# Patient Record
Sex: Female | Born: 1960 | Race: White | Hispanic: No | Marital: Married
Health system: Midwestern US, Community
[De-identification: ages and names within clinical notes are randomized; demographics above are authoritative.]

## PROBLEM LIST (undated history)

## (undated) DIAGNOSIS — I10 Essential (primary) hypertension: Secondary | ICD-10-CM

## (undated) DIAGNOSIS — E785 Hyperlipidemia, unspecified: Secondary | ICD-10-CM

## (undated) HISTORY — PX: ABDOMINAL HYSTERECTOMY: SHX81

## (undated) HISTORY — PX: TONSILLECTOMY: SUR1361

## (undated) HISTORY — PX: FOOT FUSION: SHX956

---

## 2011-10-28 ENCOUNTER — Emergency Department (INDEPENDENT_AMBULATORY_CARE_PROVIDER_SITE_OTHER)
Admission: EM | Admit: 2011-10-28 | Discharge: 2011-10-28 | Disposition: A | Discharge status: Home / Self Care | Payer: 59 | Source: Home / Self Care | Attending: Emergency Medicine | Admitting: Emergency Medicine

## 2011-10-28 ENCOUNTER — Encounter: Payer: Self-pay | Admitting: Emergency Medicine

## 2011-10-28 DIAGNOSIS — T887XXA Unspecified adverse effect of drug or medicament, initial encounter: Secondary | ICD-10-CM

## 2011-10-28 DIAGNOSIS — J069 Acute upper respiratory infection, unspecified: Secondary | ICD-10-CM

## 2011-10-28 DIAGNOSIS — H6980 Other specified disorders of Eustachian tube, unspecified ear: Secondary | ICD-10-CM

## 2011-10-28 HISTORY — DX: Essential (primary) hypertension: I10

## 2011-10-28 HISTORY — DX: Hyperlipidemia, unspecified: E78.5

## 2011-10-28 MED ORDER — AZITHROMYCIN 250 MG PO TABS: ORAL_TABLET | ORAL | Status: AC

## 2011-10-28 NOTE — ED Notes (Signed)
Being treated for ear infx since 4 days ago; on Amoxicillin and z-pack. Feels dizzy and getting worse. Did have Flu vaccine.

## 2011-10-28 NOTE — ED Provider Notes (Signed)
History     CSN: [id number] Arrival date & time: 10/28/2011  5:03 PM   First MD Initiated Contact with Patient 10/28/11 1702      Chief Complaint  Patient presents with  . Nasal Congestion    (Consider location/radiation/quality/duration/timing/severity/associated sxs/prior treatment) HPI Dorothy Hall is a 50 y.o. female who complains of onset of cold symptoms for 7 days.  She was seen by her primary care doctor a few days ago and was prescribed amoxicillin and a Medrol Dosepak. She was also given a shot in clinic but she is unsure what that was. Ever since then she is feeling of ear pain is a little but better, however she's been having some swelling in the feeling of fullness around her shoulders and posterior neck and has been feeling a little dizzy as well. She does not feel chest pain or shortness of breath. No sore throat + cough No pleuritic pain No wheezing + nasal congestion + post-nasal drainage No sinus pain/pressure No chest congestion No itchy/red eyes + Rearache No hemoptysis No SOB No chills/sweats No fever No nausea No vomiting No abdominal pain No diarrhea No skin rashes No fatigue No myalgias No headache    Past Medical History  Diagnosis Date  . Hypertension   . Hyperlipemia     Past Surgical History  Procedure Date  . Tonsillectomy   . Abdominal hysterectomy   . Foot fusion     Family History  Problem Relation Age of Onset  . Hypertension Father     History  Substance Use Topics  . Smoking status: Not on file  . Smokeless tobacco: Not on file  . Alcohol Use: No    OB History    Grav Para Term Preterm Abortions TAB SAB Ect Mult Living                  Review of Systems  Allergies  Naproxen and Sulfa antibiotics  Home Medications   Current Outpatient Rx  Name Route Sig Dispense Refill  . DICLOFENAC EPOLAMINE 1.3 % TD PTCH Transdermal Place 1 patch onto the skin 2 (two) times daily.      . DULOXETINE HCL 60 MG PO CPEP Oral Take  60 mg by mouth daily.      . AZITHROMYCIN 250 MG PO TABS  Use as directed 1 each 0    BP 140/83  Pulse 87  Temp(Src) 98.5 F (36.9 C) (Oral)  Resp 16  Ht 5\' 7"  (1.702 m)  Wt 271 lb (122.925 kg)  BMI 42.44 kg/m2  SpO2 98%  Physical Exam  Nursing note and vitals reviewed. Constitutional: She is oriented to person, place, and time. She appears well-developed and well-nourished.  HENT:  Head: Normocephalic and atraumatic.  Right Ear: External ear and ear canal normal. Tympanic membrane is bulging.  Left Ear: External ear and ear canal normal. Tympanic membrane is bulging.  Nose: Mucosal edema and rhinorrhea present.  Mouth/Throat: Uvula is midline and mucous membranes are normal. No uvula swelling. No oropharyngeal exudate, posterior oropharyngeal edema or posterior oropharyngeal erythema.  Neck: Trachea normal and normal range of motion. Neck supple. No edema and normal range of motion present.  Cardiovascular: Regular rhythm and normal heart sounds.   Pulmonary/Chest: Effort normal and breath sounds normal. No stridor. No respiratory distress.  Neurological: She is alert and oriented to person, place, and time.  Skin: Skin is warm and dry.  Psychiatric: She has a normal mood and affect. Her speech is normal.  ED Course  Procedures (including critical care time)  Labs Reviewed - No data to display No results found.   1. Medication side effect   2. Eustachian tube dysfunction   3. Acute upper respiratory infections of unspecified site       MDM    I believe she was treated properly for likely otitis media.I advised that she stop her Medrol Dosepak since I believe the she's been experiencing some generalized swelling secondary to the steroids. I think that this is what she's been feeling around her neck area. I did not see any evidence on physical examination of respiratory compromise. I will also switch her over to a Z-Pak and see if this helps a little but better. I feel  that this is mostly eustachian tube dysfunction and she can try antihistamine and decongestant to see if this helpful but better. Perhaps even chewing gum yawning may help as well. If she is not improving in the next few days she can call her primary care doctor. If she has any acute shortness of breath or worsening of swelling I have advised that she go to the emergency room.      Lily Kocher, MD 10/28/11 [phone]

## 2015-10-17 ENCOUNTER — Emergency Department (INDEPENDENT_AMBULATORY_CARE_PROVIDER_SITE_OTHER)
Admission: EM | Admit: 2015-10-17 | Discharge: 2015-10-17 | Disposition: A | Discharge status: Home / Self Care | Payer: 59 | Source: Home / Self Care | Attending: Emergency Medicine | Admitting: Emergency Medicine

## 2015-10-17 ENCOUNTER — Encounter: Payer: Self-pay | Admitting: Emergency Medicine

## 2015-10-17 DIAGNOSIS — H6691 Otitis media, unspecified, right ear: Secondary | ICD-10-CM | POA: Diagnosis not present

## 2015-10-17 MED ORDER — AZITHROMYCIN 250 MG PO TABS: 250.0000 mg | ORAL_TABLET | Freq: Every day | ORAL | Status: DC

## 2015-10-17 NOTE — ED Notes (Signed)
[facility]/O sinus pressure in face with right ear pain 4/10, cough cold and congestion, times 6 days, no fever.

## 2015-10-17 NOTE — Discharge Instructions (Signed)

## 2015-10-18 NOTE — ED Provider Notes (Signed)
CSN: [id number]     Arrival date & time 10/17/15  1619 History   First MD Initiated Contact with Patient 10/17/15 1659     Chief Complaint  Patient presents with  . Sinus Problem  . Facial Pain   (Consider location/radiation/quality/duration/timing/severity/associated sxs/prior Treatment) Patient is a 54 y.o. female presenting with sinus complaint. The history is provided by the patient. A language interpreter was used.  Sinus Problem This is a new problem. Episode onset: 6 days. The problem occurs constantly. The problem has been gradually worsening. Associated symptoms include shortness of breath. Pertinent negatives include no chest pain. Nothing aggravates the symptoms. Nothing relieves the symptoms. She has tried nothing for the symptoms. The treatment provided no relief.  Pt complains of sinus symptoms for the past week.   Pt reports now she is having bad pain in her right ear.  Pt has had an ear infection in the past and pain feels the same  Past Medical History  Diagnosis Date  . Hypertension   . Hyperlipemia    Past Surgical History  Procedure Laterality Date  . Tonsillectomy    . Abdominal hysterectomy    . Foot fusion     Family History  Problem Relation Age of Onset  . Hypertension Father    Social History  Substance Use Topics  . Smoking status: Never Smoker   . Smokeless tobacco: None  . Alcohol Use: No   OB History    No data available     Review of Systems  HENT: Positive for ear pain, rhinorrhea and sinus pressure.   Respiratory: Positive for shortness of breath.   Cardiovascular: Negative for chest pain.  All other systems reviewed and are negative.   Allergies  Naproxen; Sulfa antibiotics; and Penicillins  Home Medications   Prior to Admission medications   Medication Sig Start Date End Date Taking? Authorizing Provider  azithromycin (ZITHROMAX) 250 MG tablet Take 1 tablet (250 mg total) by mouth daily. Take first 2 tablets together, then 1  every day until finished. 10/17/15   Elson AreasLeslie K Sofia, PA-C  diclofenac (FLECTOR) 1.3 % PTCH Place 1 patch onto the skin 2 (two) times daily.      Historical Provider, MD  DULoxetine (CYMBALTA) 60 MG capsule Take 60 mg by mouth daily.      Historical Provider, MD   Meds Ordered and Administered this Visit  Medications - No data to display  BP 112/79 mmHg  Pulse 99  Temp(Src) 98.9 F (37.2 C) (Oral)  Resp 18  Ht 5\' 7"  (1.702 m)  Wt 290 lb 12 oz (131.883 kg)  BMI 45.53 kg/m2  SpO2 97% No data found.   Physical Exam  Constitutional: She appears well-developed and well-nourished.  HENT:  Head: Normocephalic and atraumatic.  Left Ear: External ear normal.  Nose: Nose normal.  Mouth/Throat: Oropharynx is clear and moist.  Right tm erythematous, slight bulging,  Unable to visualize landmarks  Eyes: Conjunctivae are normal. Pupils are equal, round, and reactive to light.  Neck: Normal range of motion.  Cardiovascular: Normal rate.   Pulmonary/Chest: Effort normal.  Abdominal: Soft.  Musculoskeletal: Normal range of motion.  Neurological: She is alert.  Skin: Skin is warm.  Psychiatric: She has a normal mood and affect.  Nursing note and vitals reviewed.   ED Course  Procedures (including critical care time)  Labs Review Labs Reviewed - No data to display  Imaging Review No results found.   Visual Acuity Review  Right Eye Distance:  Left Eye Distance:   Bilateral Distance:    Right Eye Near:   Left Eye Near:    Bilateral Near:          Pt counseled on ear infection.  Pt advised to use decongestant to help with sinus congestion.  Pt given rx for zithromax.  Pt advised she needs to see her MD in one week to make sure infection resolves.    1. Acute right otitis media, recurrence not specified, unspecified otitis media type    Meds ordered this encounter  Medications  . azithromycin (ZITHROMAX) 250 MG tablet    Sig: Take 1 tablet (250 mg total) by mouth daily.  Take first 2 tablets together, then 1 every day until finished.    Dispense:  6 tablet    Refill:  0    Order Specific Question:  Supervising Provider    Answer:  Georgina Pillion, DAVID [[id number]]    An After Visit Summary was printed and given to the patient.  Lonia Skinner Cameron, PA-C 10/18/15 1357

## 2016-06-06 ENCOUNTER — Encounter: Payer: Self-pay | Admitting: *Deleted

## 2016-06-06 ENCOUNTER — Emergency Department (INDEPENDENT_AMBULATORY_CARE_PROVIDER_SITE_OTHER): Payer: 59

## 2016-06-06 ENCOUNTER — Emergency Department (INDEPENDENT_AMBULATORY_CARE_PROVIDER_SITE_OTHER)
Admission: EM | Admit: 2016-06-06 | Discharge: 2016-06-06 | Disposition: A | Discharge status: Home / Self Care | Payer: 59 | Source: Home / Self Care | Attending: Family Medicine | Admitting: Family Medicine

## 2016-06-06 DIAGNOSIS — X58XXXA Exposure to other specified factors, initial encounter: Secondary | ICD-10-CM

## 2016-06-06 DIAGNOSIS — S92401A Displaced unspecified fracture of right great toe, initial encounter for closed fracture: Secondary | ICD-10-CM | POA: Diagnosis not present

## 2016-06-06 DIAGNOSIS — S92411A Displaced fracture of proximal phalanx of right great toe, initial encounter for closed fracture: Secondary | ICD-10-CM | POA: Diagnosis not present

## 2016-06-06 DIAGNOSIS — S80211A Abrasion, right knee, initial encounter: Secondary | ICD-10-CM

## 2016-06-06 MED ORDER — IBUPROFEN 600 MG PO TABS: 600.0000 mg | ORAL_TABLET | Freq: Once | ORAL | Status: DC

## 2016-06-06 MED ORDER — HYDROCODONE-ACETAMINOPHEN 5-325 MG PO TABS: 1.0000 | ORAL_TABLET | Freq: Four times a day (QID) | ORAL | Status: DC | PRN

## 2016-06-06 NOTE — ED Notes (Signed)
Pt c/o RT great toe pain post fall today at 1900. No OTC meds.

## 2016-06-06 NOTE — Discharge Instructions (Signed)
Norco/Vicodin (hydrocodone-acetaminophen) is a narcotic pain medication, do not combine these medications with others containing tylenol. While taking, do not drink alcohol, drive, or perform any other activities that requires focus while taking these medications.   Please keep foot elevated as much as possible.  Do not put any weight on your foot.  Please call tomorrow at [facility]8AM for follow up appointment with Sports Medicine as you will likely need referral to an orthopedic surgeon for proper and faster healing of broken toe.

## 2016-06-06 NOTE — ED Provider Notes (Signed)
CSN: [id number]     Arrival date & time 06/06/16  1940 History   First MD Initiated Contact with Patient 06/06/16 2001     Chief Complaint  Patient presents with  . Foot Injury   (Consider location/radiation/quality/duration/timing/severity/associated sxs/prior Treatment) HPI Dorothy Hall is a [age] y.o. female presenting to [facility] with c/o sudden onset Right great toe pain with deformity after tripping on cement step while walking to the pool around 7PM.  Pain is 7/10, aching and throbbing, waxing and waning. Unable to bear weight. She also reports scrapping her Right knee but only has minimal pain of knee.  No pain medication taken PTA.  Denies hitting her head or LOC. Hx of foot surgery about 15 years ago. Does not currently have an orthopedist.     Past Medical History  Diagnosis Date  . Hypertension   . Hyperlipemia    Past Surgical History  Procedure Laterality Date  . Tonsillectomy    . Abdominal hysterectomy    . Foot fusion     Family History  Problem Relation Age of Onset  . Hypertension Father    Social History  Substance Use Topics  . Smoking status: Never Smoker   . Smokeless tobacco: None  . Alcohol Use: Yes   OB History    No data available     Review of Systems  Musculoskeletal: Positive for myalgias, joint swelling and arthralgias.  Skin: Positive for wound. Negative for color change.  Neurological: Negative for weakness and numbness.    Allergies  Prednisone; Sulfa antibiotics; Naproxen; and Penicillins  Home Medications   Prior to Admission medications   Medication Sig Start Date End Date Taking? Authorizing Provider  amLODipine (NORVASC) 5 MG tablet Take 5 mg by mouth daily.   Yes Historical Provider, MD  hydrochlorothiazide (MICROZIDE) 12.5 MG capsule Take 12.5 mg by mouth daily.   Yes Historical Provider, MD  levothyroxine (SYNTHROID, LEVOTHROID) 100 MCG tablet Take 100 mcg by mouth daily before breakfast.   Yes Historical Provider, MD  sertraline  (ZOLOFT) 50 MG tablet Take 50 mg by mouth daily.   Yes Historical Provider, MD  DULoxetine (CYMBALTA) 60 MG capsule Take 60 mg by mouth daily.      Historical Provider, MD  HYDROcodone-acetaminophen (NORCO/VICODIN) 5-325 MG tablet Take 1 tablet by mouth every 6 (six) hours as needed for moderate pain or severe pain. 06/06/16   Junius FinnerErin O'Malley, PA-C   Meds Ordered and Administered this Visit   Medications  ibuprofen (ADVIL,MOTRIN) tablet 600 mg (not administered)    BP 141/83 mmHg  Pulse 94  Temp(Src) 97.7 F (36.5 C) (Oral)  Resp 18  Ht 5' 6.75" (1.695 m)  Wt 305 lb (138.347 kg)  BMI 48.15 kg/m2  SpO2 98% No data found.   Physical Exam  Constitutional: She is oriented to person, place, and time. She appears well-developed and well-nourished.  HENT:  Head: Normocephalic and atraumatic.  Eyes: EOM are normal.  Neck: Normal range of motion.  Cardiovascular: Normal rate.   Right great toe: cap refill < 3 seconds  Pulmonary/Chest: Effort normal.  Musculoskeletal: She exhibits edema and tenderness.  Right great toe: mild deformity, limited flexion and extension. Diffuse tenderness.   Right foot: well healed surgical scar along first metatarsal. Non-tender. Full ROM ankle and knee.  Calf is soft, non-tender.  Neurological: She is alert and oriented to person, place, and time.  Right great toe: normal sensation to light touch.  Skin: Skin is warm and dry.  Right  great toe: skin in tact, slight tenting of the skin.  No ecchymosis or erythema. Right knee: superficial abrasion, bleeding controlled.   Psychiatric: She has a normal mood and affect. Her behavior is normal.  Nursing note and vitals reviewed.   ED Course  Procedures (including critical care time)  Labs Review Labs Reviewed - No data to display  Imaging Review Dg Foot Complete Right  06/06/2016  CLINICAL DATA:  Right foot pain EXAM: RIGHT FOOT COMPLETE - 3+ VIEW COMPARISON:  None. FINDINGS: There is a transverse  fracture of the distal aspect of the first proximal phalanx. There is evidence of prior Lisfranc joint arthrodesis without hardware failure complication. 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 There is a plantar calcaneal spur. IMPRESSION: Transverse fracture of the distal aspect of the first proximal phalanx. Electronically Signed   By: Elige Ko   On: 06/06/2016 20:23      MDM   1. Fracture of great toe, right, closed, initial encounter   2. Knee abrasion, right, initial encounter    Pt c/o sudden onset Right great toe. Mild deformity. Sensation and circulation in tact. Slight decreased ROM. Diffuse tenderness.  Plain films: significant for transverse fracture of distal aspect of first proximal phalanx. Consulted with Dr. Benjamin Stain, Sports Medicine. Pt will likely need referral to orthopedic surgeon.  Will place in post-op shoe for now. Pt has crutches at home. Advised to keep elevated and not bear any weight on foot.   Call office first thing tomorrow at 8AM to schedule f/u.  Rx: norco Pt has diclofenac at home, may take as well for pain.  Patient verbalized understanding and agreement with treatment plan.      Junius Finner, PA-C 06/06/16 2056

## 2016-06-07 ENCOUNTER — Ambulatory Visit (INDEPENDENT_AMBULATORY_CARE_PROVIDER_SITE_OTHER): Payer: 59

## 2016-06-07 ENCOUNTER — Encounter: Payer: Self-pay | Admitting: Sports Medicine

## 2016-06-07 ENCOUNTER — Ambulatory Visit (INDEPENDENT_AMBULATORY_CARE_PROVIDER_SITE_OTHER): Payer: 59 | Admitting: Sports Medicine

## 2016-06-07 ENCOUNTER — Telehealth: Payer: Self-pay

## 2016-06-07 ENCOUNTER — Telehealth: Payer: Self-pay | Admitting: *Deleted

## 2016-06-07 VITALS — BP 124/89 | HR 87 | Resp 18 | Wt 305.0 lb

## 2016-06-07 DIAGNOSIS — X58XXXA Exposure to other specified factors, initial encounter: Secondary | ICD-10-CM | POA: Diagnosis not present

## 2016-06-07 DIAGNOSIS — S92401A Displaced unspecified fracture of right great toe, initial encounter for closed fracture: Secondary | ICD-10-CM | POA: Diagnosis not present

## 2016-06-07 DIAGNOSIS — S92411A Displaced fracture of proximal phalanx of right great toe, initial encounter for closed fracture: Secondary | ICD-10-CM

## 2016-06-07 NOTE — Telephone Encounter (Signed)
Pt is in need of a letter for works stating her work restrictions. Please Petra Kubaadivise

## 2016-06-07 NOTE — Telephone Encounter (Signed)
Spoke to pt advised her per dr T the ortho surgeon said her toe was non operative. Dr T will see her this morning to reduce the fracture. Pt to call his office to schedule appt. Dorothy Hall in primary care notified okay to schedule appt with Dr T this morning. Dorothy Catholichristy Jacqueline Spofford, LPN

## 2016-06-07 NOTE — Progress Notes (Signed)
   Subjective:    I'm seeing this patient as a consultation for:  Dorothy FinnerErin O'Malley PA-C, Dorothy Hall, M.D.  CC: Right great toe fracture  HPI: Yesterday this pleasant 55 year old female fell down the stairs, she had immediate pain, swelling, bruising and deformity in her toe, she was seen in urgent care where x-rays showed a displaced, angulated fracture of the right first proximal phalanx. She is referred to me for further evaluation and definitive treatment. Pain is moderate, improving. She has been in a postop shoe.  Past medical history, Surgical history, Family history not pertinant except as noted below, Social history, Allergies, and medications have been entered into the medical record, reviewed, and no changes needed.   Review of Systems: No headache, visual changes, nausea, vomiting, diarrhea, constipation, dizziness, abdominal pain, skin rash, fevers, chills, night sweats, weight loss, swollen lymph nodes, body aches, joint swelling, muscle aches, chest pain, shortness of breath, mood changes, visual or auditory hallucinations.   Objective:   General: Well Developed, well nourished, and in no acute distress.  Neuro/Psych: Alert and oriented x3, extra-ocular muscles intact, able to move all 4 extremities, sensation grossly intact. Skin: Warm and dry, no rashes noted.  Respiratory: Not using accessory muscles, speaking in full sentences, trachea midline.  Cardiovascular: Pulses palpable, no extremity edema. Abdomen: Does not appear distended. Right Foot: No visible erythema or swelling. Range of motion is full in all directions. Strength is 5/5 in all directions. No hallux valgus. No pes cavus or pes planus. No abnormal callus noted. No pain over the navicular prominence, or base of fifth metatarsal. No tenderness to palpation of the calcaneal insertion of plantar fascia. No pain at the Achilles insertion. No pain over the calcaneal bursa. No pain of the retrocalcaneal  bursa. Swelling and tenderness over the first proximal phalanx No hallux rigidus or limitus. No tenderness palpation over interphalangeal joints. No pain with compression of the metatarsal heads. Neurovascularly intact distally.  X-rays reviewed and show an apex plantar, 50% dorsally displaced fracture at the distal condyle of the right first proximal phalanx.  Procedure:  Fracture Reduction   Risks, benefits, and alternatives explained and consent obtained. Time out conducted. Surface prepped with alcohol. 5 mL lidocaine and Marcaine infiltrated in a hematoma block. Adequate anesthesia ensured. Fracture reduction: I applied axial force to the interphalangeal joint, applied a valgus stress, and then plantar flex the IP joint and applied a varus force, I felt a pop. Post reduction films obtained showed anatomic/near-anatomic alignment. Pt stable, aftercare and follow-up advised.  Impression and Recommendations:   This case required medical decision making of moderate complexity.

## 2016-06-07 NOTE — Assessment & Plan Note (Signed)
Apex volar angulation with a dorsally displaced distal fragment at the distal tip of the proximal phalanx of the right great toe. Close reduction, postop shoe, patient has some pain medicine already. Return to see me in 2 weeks, repeat x-rays before visit.   I billed a fracture code for this encounter, all subsequent visits will be post-op checks in the global period.

## 2016-06-08 NOTE — Telephone Encounter (Signed)
Letter in box. 

## 2016-06-08 NOTE — Telephone Encounter (Signed)
Left VM stating letter is available for pick up.

## 2016-06-12 ENCOUNTER — Telehealth: Payer: Self-pay

## 2016-06-12 NOTE — Telephone Encounter (Signed)
Pt left VM asking if her work note can have more specifics: Can she navigate stairs? How long is she allowed to work in a seated position? Please assist.

## 2016-06-12 NOTE — Telephone Encounter (Signed)
Letter printed.

## 2016-06-20 ENCOUNTER — Other Ambulatory Visit: Payer: Self-pay | Admitting: Sports Medicine

## 2016-06-20 ENCOUNTER — Encounter: Payer: Self-pay | Admitting: Sports Medicine

## 2016-06-20 ENCOUNTER — Ambulatory Visit (INDEPENDENT_AMBULATORY_CARE_PROVIDER_SITE_OTHER): Payer: 59 | Admitting: Sports Medicine

## 2016-06-20 ENCOUNTER — Ambulatory Visit (INDEPENDENT_AMBULATORY_CARE_PROVIDER_SITE_OTHER): Payer: 59

## 2016-06-20 DIAGNOSIS — S92401D Displaced unspecified fracture of right great toe, subsequent encounter for fracture with routine healing: Secondary | ICD-10-CM

## 2016-06-20 DIAGNOSIS — W010XXD Fall on same level from slipping, tripping and stumbling without subsequent striking against object, subsequent encounter: Secondary | ICD-10-CM | POA: Diagnosis not present

## 2016-06-20 DIAGNOSIS — S92421D Displaced fracture of distal phalanx of right great toe, subsequent encounter for fracture with routine healing: Secondary | ICD-10-CM

## 2016-06-20 NOTE — Progress Notes (Signed)
  Subjective: 2 weeks post closed reduction of a right first proximal phalangeal fracture, doing well.   Objective: General: Well-developed, well-nourished, and in no acute distress. Right foot: Minimally swollen, only minimally tender over the fracture, neurovascularly intact distally.  X-rays reviewed and show good stability with anatomic alignment of the right first proximal phalanx.  Assessment/plan:

## 2016-06-20 NOTE — Assessment & Plan Note (Signed)
Continued stability of alignment postreduction.  Continue postop shoe for 4 more weeks, return to see me in 4 weeks, no more x-rays needed. We will likely transition her to a regular shoe in 4 weeks at the six-week mark

## 2016-07-19 ENCOUNTER — Encounter: Payer: Self-pay | Admitting: Sports Medicine

## 2016-07-19 ENCOUNTER — Ambulatory Visit (INDEPENDENT_AMBULATORY_CARE_PROVIDER_SITE_OTHER): Payer: 59 | Admitting: Sports Medicine

## 2016-07-19 DIAGNOSIS — S92401D Displaced unspecified fracture of right great toe, subsequent encounter for fracture with routine healing: Secondary | ICD-10-CM

## 2016-07-19 NOTE — Assessment & Plan Note (Signed)
Doing well post fracture and reduction, return for custom orthotics, no further intervention needed.

## 2016-07-19 NOTE — Progress Notes (Signed)
  Subjective: 6 weeks post fracture of the first proximal phalanx, doing well.   Objective: General: Well-developed, well-nourished, and in no acute distress. Right foot: Minimal swelling at the interphalangeal joint, motion is pretty good, no tenderness to palpation.  Assessment/plan:   No problem-specific Assessment & Plan notes found for this encounter.

## 2016-07-24 ENCOUNTER — Ambulatory Visit (INDEPENDENT_AMBULATORY_CARE_PROVIDER_SITE_OTHER): Payer: 59 | Admitting: Sports Medicine

## 2016-07-24 DIAGNOSIS — S92401D Displaced unspecified fracture of right great toe, subsequent encounter for fracture with routine healing: Secondary | ICD-10-CM

## 2016-07-24 NOTE — Assessment & Plan Note (Signed)
Custom orthotics as above. 

## 2016-07-24 NOTE — Progress Notes (Signed)

## 2017-07-22 IMAGING — DX DG FOOT COMPLETE 3+V*R*
3 series · 3 of 3 positions shown · non-contrast
Comparison: None.

CLINICAL DATA: Right foot pain

EXAM:
RIGHT FOOT COMPLETE - 3+ VIEW

[foot ap]
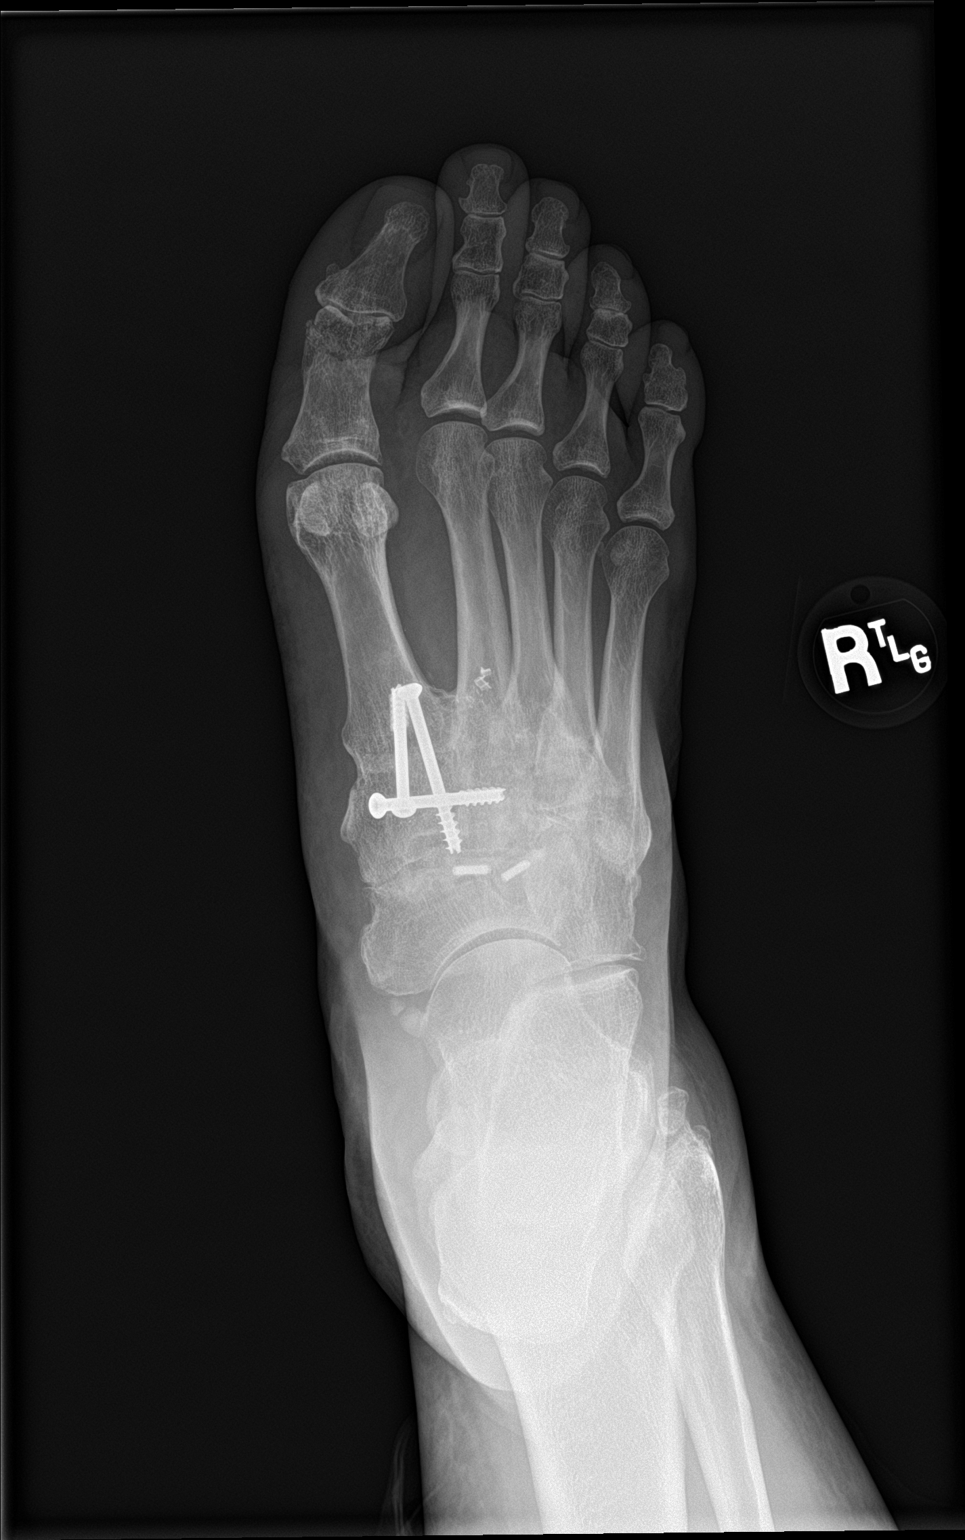

[foot obl]
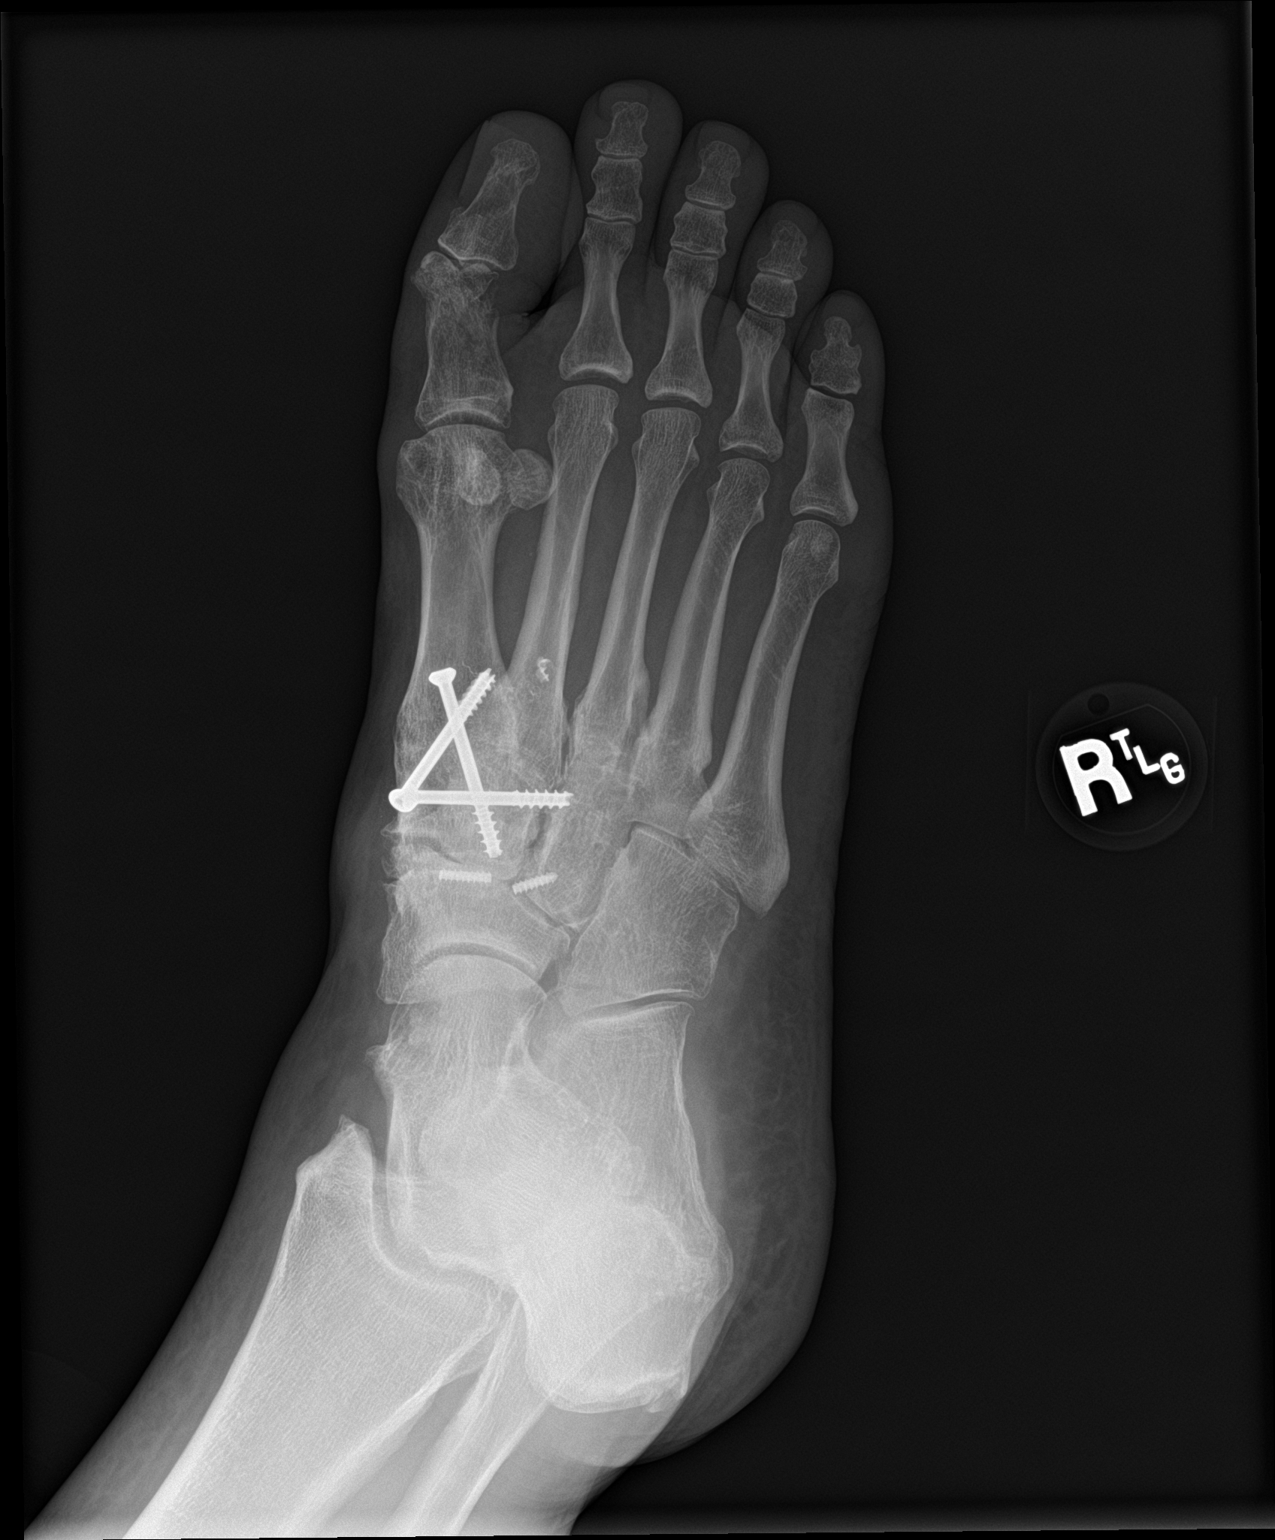

[foot lat]
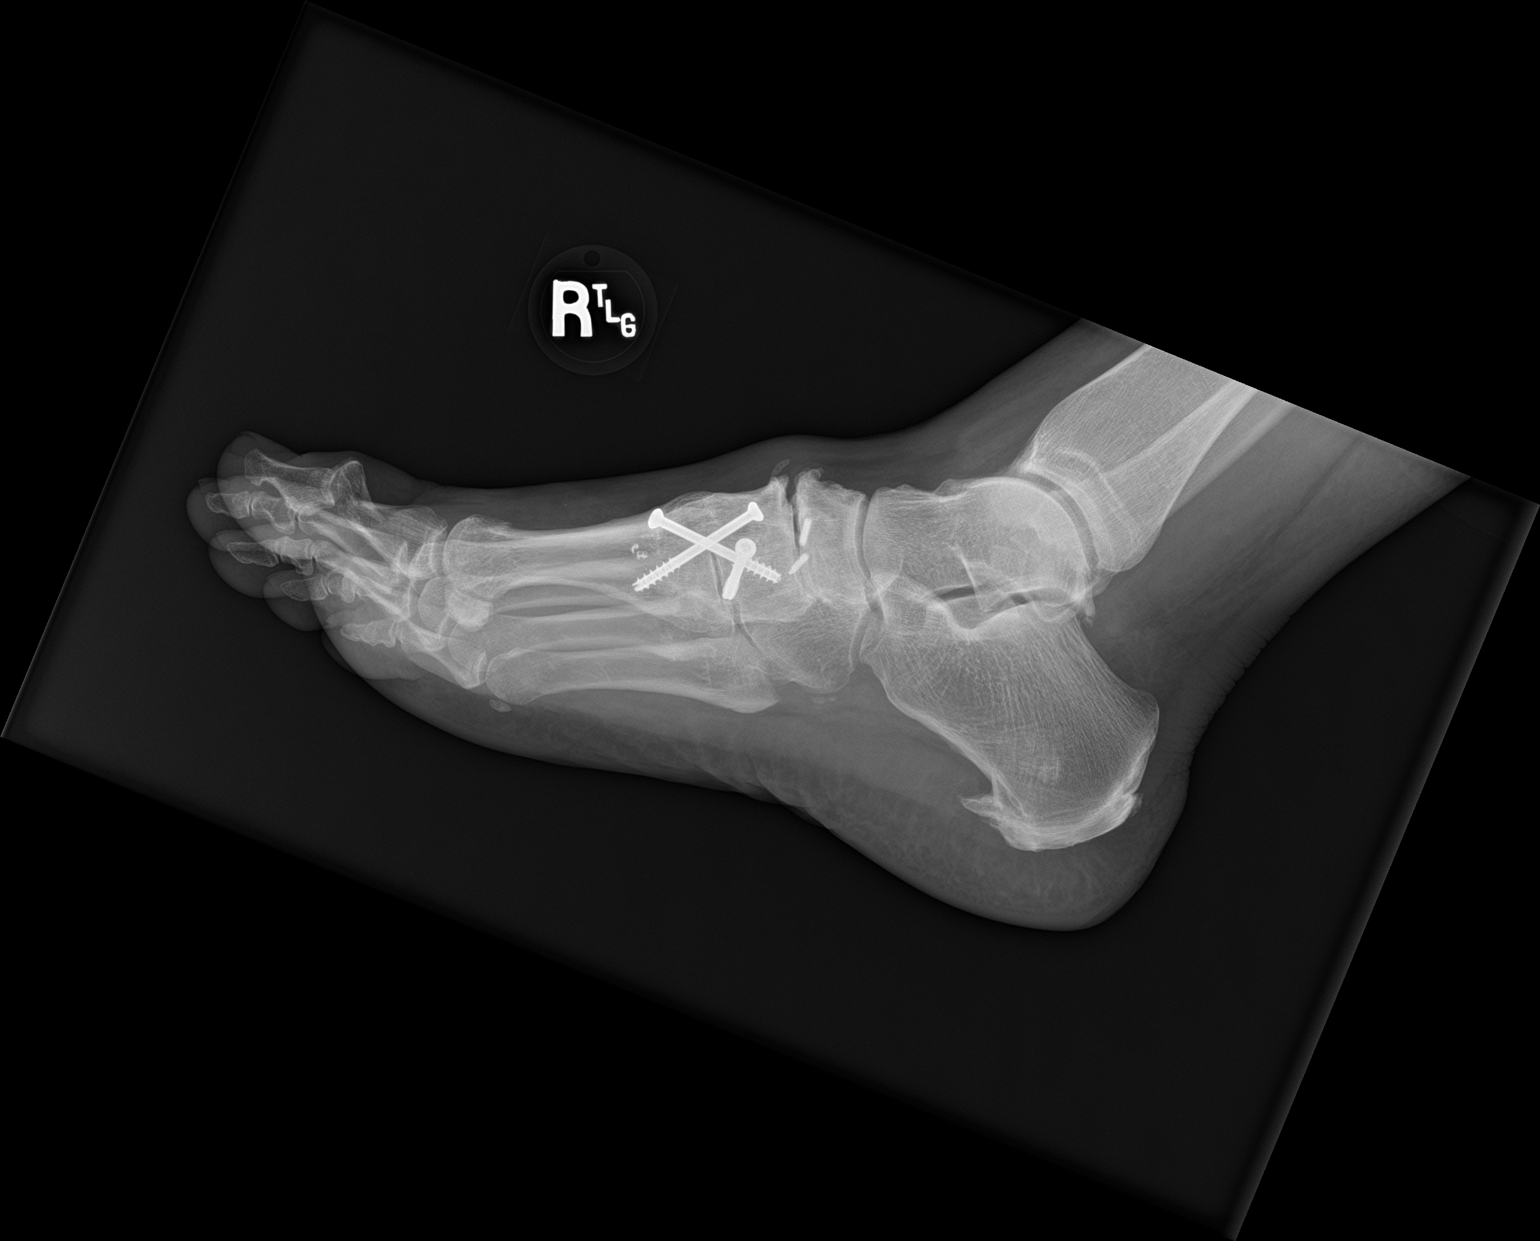

[3 of 3 positions shown; findings below may reference images not displayed]

FINDINGS: There is a transverse fracture of the distal aspect of the first
proximal phalanx. There is evidence of prior Lisfranc joint
arthrodesis without hardware failure complication. Severe
osteoarthritis of the navicular cuneiform articulation. There is a
plantar calcaneal spur.
IMPRESSION: Transverse fracture of the distal aspect of the first proximal
phalanx.

## 2017-08-05 IMAGING — DX DG TOE GREAT 2+V*R*
3 series · 3 of 3 positions shown · non-contrast
Comparison: June 07, 2016

CLINICAL DATA: Persistent pain after recent traumatic injury

EXAM:
RIGHT FIRST TOE:  3  V

[toe ap]
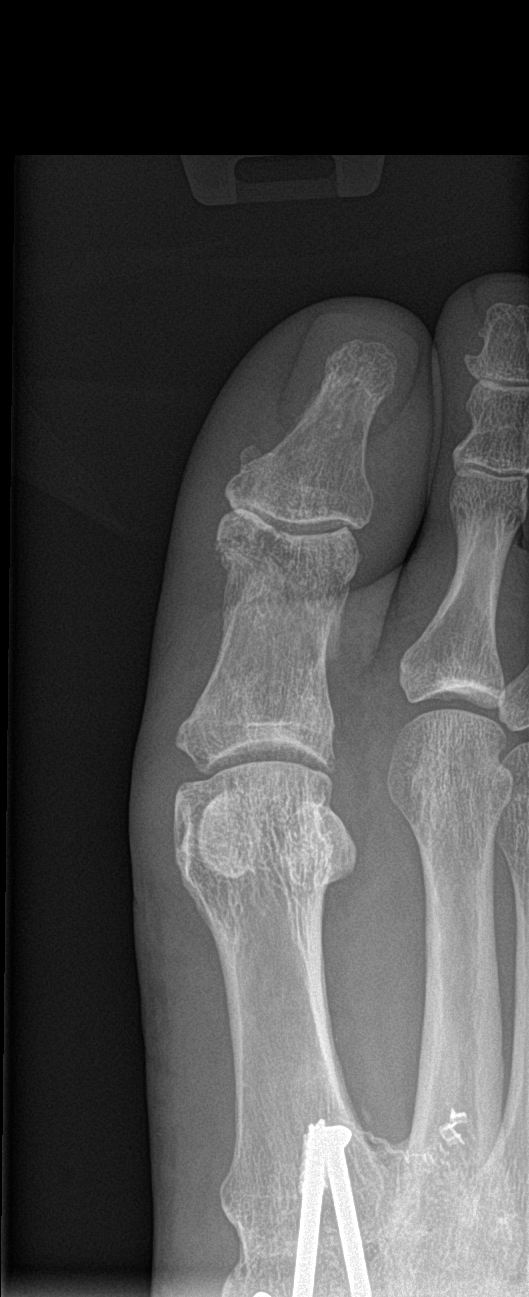

[toe obl]
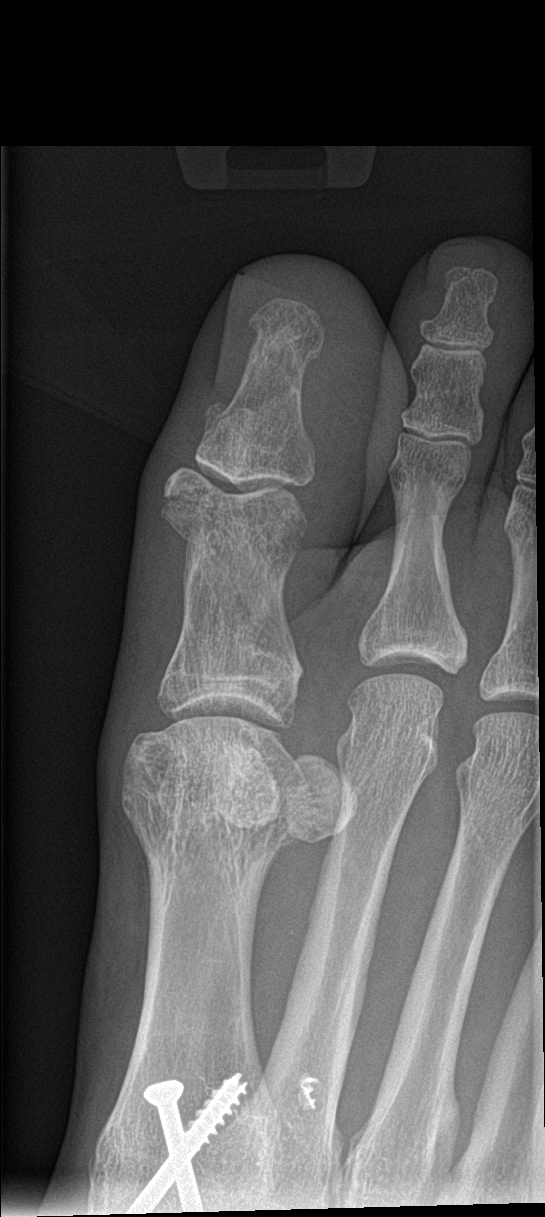

[toe lat]
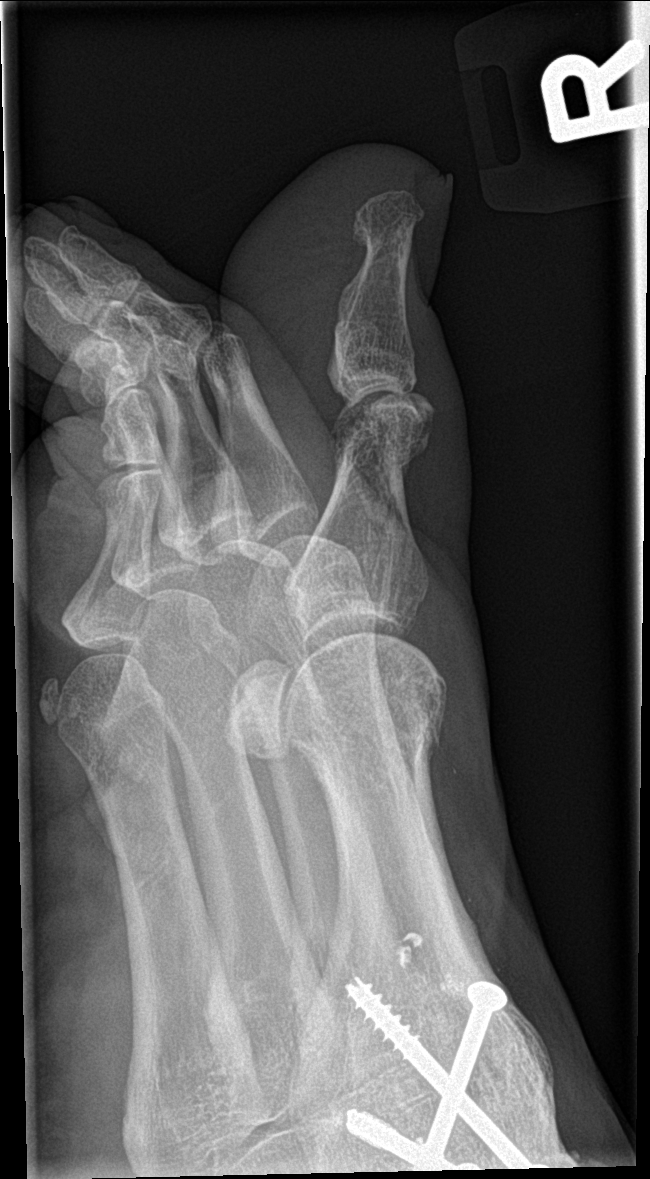

[3 of 3 positions shown; findings below may reference images not displayed]

FINDINGS: Frontal, oblique, and lateral views were obtained. The fracture of
the distal aspect of the first proximal phalanx is again noted with
fracture fragments in near anatomic alignment. There is no
appreciable callus formation. No new fracture. No dislocation. There
is mild narrowing of the first MTP joint. Postoperative change is
noted in the first tarsal-metatarsal joint region.
IMPRESSION: Fracture distal aspect of the first proximal phalanx with alignment
near anatomic and essentially stable. No appreciable callus
formation. No new fracture. No dislocation. Previous postoperative
change in the first tarsal -metatarsal joint region.

## 2019-11-26 ENCOUNTER — Inpatient Hospital Stay
Admit: 2019-11-26 | Discharge: 2019-11-26 | Disposition: A | Discharge status: Home / Self Care | Payer: BLUE CROSS/BLUE SHIELD | Attending: Emergency Medicine

## 2019-11-26 ENCOUNTER — Emergency Department: Admit: 2019-11-26 | Payer: BLUE CROSS/BLUE SHIELD | Primary: Family

## 2019-11-26 DIAGNOSIS — R079 Chest pain, unspecified: Secondary | ICD-10-CM

## 2019-11-26 LAB — METABOLIC PANEL, COMPREHENSIVE
A-G Ratio: 1.1 — ABNORMAL LOW (ref 1.2–3.5)
ALT (SGPT): 55 U/L (ref 12–65)
AST (SGOT): 30 U/L (ref 15–37)
Albumin: 3.9 g/dL (ref 3.5–5.0)
Alk. phosphatase: 97 U/L (ref 50–136)
Anion gap: 5 mmol/L — ABNORMAL LOW (ref 7–16)
BUN: 10 MG/DL (ref 6–23)
Bilirubin, total: 0.9 MG/DL (ref 0.2–1.1)
CO2: 27 mmol/L (ref 21–32)
Calcium: 9.5 MG/DL (ref 8.3–10.4)
Chloride: 107 mmol/L (ref 98–107)
Creatinine: 0.84 MG/DL (ref 0.6–1.0)
GFR est AA: >60 mL/min/{1.73_m2} (ref >60)
GFR est non-AA: >60 mL/min/{1.73_m2} (ref >60)
Globulin: 3.7 g/dL — ABNORMAL HIGH (ref 2.3–3.5)
Glucose: 90 mg/dL (ref 65–100)
Potassium: 3.8 mmol/L (ref 3.5–5.1)
Protein, total: 7.6 g/dL (ref 6.3–8.2)
Sodium: 139 mmol/L (ref 136–145)

## 2019-11-26 LAB — CBC WITH AUTOMATED DIFF
ABS. BASOPHILS: 0.1 10*3/uL (ref 0.0–0.2)
ABS. EOSINOPHILS: 0.1 10*3/uL (ref 0.0–0.8)
ABS. IMM. GRANS.: 0 10*3/uL (ref 0.0–0.5)
ABS. LYMPHOCYTES: 3.1 10*3/uL (ref 0.5–4.6)
ABS. MONOCYTES: 0.4 10*3/uL (ref 0.1–1.3)
ABS. NEUTROPHILS: 3 10*3/uL (ref 1.7–8.2)
ABSOLUTE NRBC: 0 10*3/uL (ref 0.0–0.2)
BASOPHILS: 1 % (ref 0.0–2.0)
EOSINOPHILS: 2 % (ref 0.5–7.8)
HCT: 44.8 % (ref 35.8–46.3)
HGB: 14.7 g/dL (ref 11.7–15.4)
IMMATURE GRANULOCYTES: 0 % (ref 0.0–5.0)
LYMPHOCYTES: 46 % — ABNORMAL HIGH (ref 13–44)
MCH: 29.7 PG (ref 26.1–32.9)
MCHC: 32.8 g/dL (ref 31.4–35.0)
MCV: 90.5 FL (ref 79.6–97.8)
MONOCYTES: 6 % (ref 4.0–12.0)
MPV: 9.5 FL (ref 9.4–12.3)
NEUTROPHILS: 45 % (ref 43–78)
PLATELET COMMENTS: ADEQUATE
PLATELET: 254 10*3/uL (ref 150–450)
RBC: 4.95 M/uL (ref 4.05–5.2)
RDW: 12.8 % (ref 11.9–14.6)
WBC: 6.7 10*3/uL (ref 4.3–11.1)

## 2019-11-26 LAB — TROPONIN-HIGH SENSITIVITY
Troponin-High Sensitivity: 6.5 pg/mL (ref 0–14)
Troponin-High Sensitivity: 7.6 pg/mL (ref 0–14)

## 2019-11-26 LAB — CBC WITH AUTO DIFFERENTIAL
Basophils %: 1 % (ref 0.0–2.0)
Basophils Absolute: 0.1 10*3/uL (ref 0.0–0.2)
Eosinophils %: 2 % (ref 0.5–7.8)
Eosinophils Absolute: 0.1 10*3/uL (ref 0.0–0.8)
Granulocyte Absolute Count: 0 10*3/uL (ref 0.0–0.5)
Hematocrit: 44.8 % (ref 35.8–46.3)
Hemoglobin: 14.7 g/dL (ref 11.7–15.4)
Immature Granulocytes: 0 % (ref 0.0–5.0)
Lymphocytes %: 46 % — ABNORMAL HIGH (ref 13–44)
Lymphocytes Absolute: 3.1 10*3/uL (ref 0.5–4.6)
MCH: 29.7 PG (ref 26.1–32.9)
MCHC: 32.8 g/dL (ref 31.4–35.0)
MCV: 90.5 FL (ref 79.6–97.8)
MPV: 9.5 FL (ref 9.4–12.3)
Monocytes %: 6 % (ref 4.0–12.0)
Monocytes Absolute: 0.4 10*3/uL (ref 0.1–1.3)
NRBC Absolute: 0 10*3/uL (ref 0.0–0.2)
Neutrophils %: 45 % (ref 43–78)
Neutrophils Absolute: 3 10*3/uL (ref 1.7–8.2)
Platelet Comment: ADEQUATE
Platelets: 254 10*3/uL (ref 150–450)
RBC: 4.95 M/uL (ref 4.05–5.2)
RDW: 12.8 % (ref 11.9–14.6)
WBC: 6.7 10*3/uL (ref 4.3–11.1)

## 2019-11-26 LAB — COMPREHENSIVE METABOLIC PANEL
ALT: 55 U/L (ref 12–65)
AST: 30 U/L (ref 15–37)
Albumin/Globulin Ratio: 1.1 — ABNORMAL LOW (ref 1.2–3.5)
Albumin: 3.9 g/dL (ref 3.5–5.0)
Alkaline Phosphatase: 97 U/L (ref 50–136)
Anion Gap: 5 mmol/L — ABNORMAL LOW (ref 7–16)
BUN: 10 MG/DL (ref 6–23)
CO2: 27 mmol/L (ref 21–32)
Calcium: 9.5 MG/DL (ref 8.3–10.4)
Chloride: 107 mmol/L (ref 98–107)
Creatinine: 0.84 MG/DL (ref 0.6–1.0)
EGFR IF NonAfrican American: >60 mL/min/{1.73_m2} (ref >60)
GFR African American: >60 mL/min/{1.73_m2} (ref >60)
Globulin: 3.7 g/dL — ABNORMAL HIGH (ref 2.3–3.5)
Glucose: 90 mg/dL (ref 65–100)
Potassium: 3.8 mmol/L (ref 3.5–5.1)
Sodium: 139 mmol/L (ref 136–145)
Total Bilirubin: 0.9 MG/DL (ref 0.2–1.1)
Total Protein: 7.6 g/dL (ref 6.3–8.2)

## 2019-11-26 LAB — TROPONIN, HIGH SENSITIVITY
Troponin, High Sensitivity: 6.5 pg/mL (ref 0–14)
Troponin, High Sensitivity: 7.6 pg/mL (ref 0–14)

## 2019-11-26 MED ORDER — LABETALOL 5 MG/ML IV SOLN
5 mg/mL | Freq: Once | INTRAVENOUS | Status: DC
Start: 2019-11-26 — End: 2019-11-26

## 2019-11-26 MED ORDER — ASPIRIN 325 MG TAB
325 mg | ORAL | Status: AC
Start: 2019-11-26 — End: 2019-11-26
  Administered 2019-11-26: 19:00:00 via ORAL

## 2019-11-26 MED FILL — LABETALOL 5 MG/ML IV SOLN: 5 mg/mL | INTRAVENOUS | Qty: 20

## 2019-11-26 MED FILL — ASPIRIN 325 MG TAB: 325 mg | ORAL | Qty: 1

## 2019-11-26 NOTE — ED Notes (Signed)
Md made aware of pt's BP. MD ordered to hold labetalol.

## 2019-11-26 NOTE — ED Notes (Signed)

## 2019-11-26 NOTE — ED Triage Notes (Signed)
Pt states she has been dealing with htn for the last month.  For the last week she has had left sided chest pain that radiates to her jaw.  Describes pain as sharp and rates 4/10.  Does not see a cardiologist.  Takes medication for htn.  States she has some shob with exertion.

## 2019-11-26 NOTE — ED Provider Notes (Signed)
[facility]       HPI:    58 year old female history of obesity, hypertension presents to the emergency department with complaint of levator blood pressure for the past month despite her losartan being increased.  Over the past week she also reported some left-sided chest pain that radiates to her left jaw.  She does have some dyspnea on exertion.  Describes the pain as sharp and rates it a 4 out of 10.  She has no prior known history of CAD CHF VHD or cardiac arrhythmia.  No prior stress test or catheterization    Patient states she had an EKG from her PCP which was abnormal so she was sent to the ED for evaluation.    Patient denies any surgery requiring general anesthesia, travel, or significant immobilization in the past 4 weeks.  Pt denies hemoptysis.  Pt denies asymmetric lower extremity edema.  Pt denies known history of malignancy.  Denies prior history of venous thromboembolism or known hypercoagulable disorder.  Denies use of estrogen containing medications.    She does report a history of GERD and has been on omeprazole for the past few days.  Denies back pain or tearing sensation of the chest/thorax.    Patient declined any medications after my initial evaluation.    REVIEW OF SYSTEMS     ROS Negative Except as Listed in HPI    PAST MEDICAL HISTORY     No past medical history on file.  No past surgical history on file.     None     Allergies   Allergen Reactions   ??? Pcn [Penicillins] Anaphylaxis   ??? Naproxen Swelling   ??? Sulfa (Sulfonamide Antibiotics) Rash        PHYSICAL EXAM       Vitals:    11/26/19 1118   BP: (!) 175/111   Pulse: 71   Resp: 16   Temp: 97.8 ??F (36.6 ??C)   SpO2: 97%    Vital signs were reviewed.     Physical Exam  Vitals signs and nursing note reviewed.   Constitutional:       General: She is not in acute distress.     Appearance: She is obese. She is not ill-appearing, toxic-appearing or diaphoretic.   Neck:      Vascular: No JVD.    Cardiovascular:      Rate and Rhythm: Normal rate and regular rhythm.      Pulses:           Radial pulses are 2+ on the right side and 2+ on the left side.      Heart sounds: No murmur.   Pulmonary:      Effort: Pulmonary effort is normal.      Breath sounds: Normal breath sounds.   Abdominal:      Palpations: Abdomen is soft.   Musculoskeletal:      Comments: No lower extremity edema.  No palpable cords in lower extremities.  No calf knee or thigh tenderness.   Skin:     General: Skin is warm and dry.   Neurological:      Comments: Awake, alert. GCS 15. CN II-XII grossly intact. Speech clear. No gross lateralizing neuro deficits.    Psychiatric:         Behavior: Behavior normal.          MEDICAL DECISION MAKING       Initial Impression and Treatment Plan  Afebrile nontoxic 58 year old female  presented to ED with chest pain for the past week in the setting of increased blood pressures for the past month.  Initial EKG and troponin have already resulted by the time I evaluated her and they are without ischemia.  Patient does have both typical and atypical features for cardiac etiology.  Low risk Wells criteria for PE.  No back pain or tearing sensation the chest/thorax.  No murmurs.  Equal radial pulses.  Aortic dissection seems less likely.  No complaints of abdominal pain nor any abdominal tenderness on exam.    EKG performed at 11:10 AM.  Normal sinus rhythm at 69.  Normal axis.  No ST elevations or depressions.  No significant T wave abnormalities.  Intervals normal      Recent Results (from the past 8 hour(s))   CBC WITH AUTOMATED DIFF    Collection Time: 11/26/19 11:23 AM   Result Value Ref Range    WBC 6.7 4.3 - 11.1 K/uL    RBC 4.95 4.05 - 5.2 M/uL    HGB 14.7 11.7 - 15.4 g/dL    HCT 44.8 35.8 - 46.3 %    MCV 90.5 79.6 - 97.8 FL    MCH 29.7 26.1 - 32.9 PG    MCHC 32.8 31.4 - 35.0 g/dL    RDW 12.8 11.9 - 14.6 %    PLATELET 254 150 - 450 K/uL    MPV 9.5 9.4 - 12.3 FL    ABSOLUTE NRBC 0.00 0.0 - 0.2 K/uL     NEUTROPHILS 45 43 - 78 %    LYMPHOCYTES 46 (H) 13 - 44 %    MONOCYTES 6 4.0 - 12.0 %    EOSINOPHILS 2 0.5 - 7.8 %    BASOPHILS 1 0.0 - 2.0 %    IMMATURE GRANULOCYTES 0 0.0 - 5.0 %    ABS. NEUTROPHILS 3.0 1.7 - 8.2 K/UL    ABS. LYMPHOCYTES 3.1 0.5 - 4.6 K/UL    ABS. MONOCYTES 0.4 0.1 - 1.3 K/UL    ABS. EOSINOPHILS 0.1 0.0 - 0.8 K/UL    ABS. BASOPHILS 0.1 0.0 - 0.2 K/UL    ABS. IMM. GRANS. 0.0 0.0 - 0.5 K/UL    RBC COMMENTS NORMOCYTIC/NORMOCHROMIC      WBC COMMENTS ATYPICAL LYMPHOCYTES PRESENT      PLATELET COMMENTS ADEQUATE      DF AUTOMATED     METABOLIC PANEL, COMPREHENSIVE    Collection Time: 11/26/19 11:23 AM   Result Value Ref Range    Sodium 139 136 - 145 mmol/L    Potassium 3.8 3.5 - 5.1 mmol/L    Chloride 107 98 - 107 mmol/L    CO2 27 21 - 32 mmol/L    Anion gap 5 (L) 7 - 16 mmol/L    Glucose 90 65 - 100 mg/dL    BUN 10 6 - 23 MG/DL    Creatinine 0.84 0.6 - 1.0 MG/DL    GFR est AA >60 >60 ml/min/1.25m    GFR est non-AA >60 >60 ml/min/1.76m   Calcium 9.5 8.3 - 10.4 MG/DL    Bilirubin, total 0.9 0.2 - 1.1 MG/DL    ALT (SGPT) 55 12 - 65 U/L    AST (SGOT) 30 15 - 37 U/L    Alk. phosphatase 97 50 - 136 U/L    Protein, total 7.6 6.3 - 8.2 g/dL    Albumin 3.9 3.5 - 5.0 g/dL    Globulin 3.7 (H) 2.3 - 3.5 g/dL  A-G Ratio 1.1 (L) 1.2 - 3.5     TROPONIN-HIGH SENSITIVITY    Collection Time: 11/26/19 11:23 AM   Result Value Ref Range    Troponin-High Sensitivity 6.5 0 - 14 pg/mL   TROPONIN-HIGH SENSITIVITY    Collection Time: 11/26/19  1:48 PM   Result Value Ref Range    Troponin-High Sensitivity 7.6 0 - 14 pg/mL         Xr Chest Pa Lat    Result Date: 11/26/2019   EXAMINATION: CHEST RADIOGRAPH 11/26/2019 12:26 PM ACCESSION NUMBER: [id number] INDICATION: chest pain and shob COMPARISON: None TECHNIQUE: PA  and lateral views of the chest were obtained. FINDINGS: Cardiac Silhouette: Enlarged. Lungs: No focal airspace disease. Pleura: No pleural effusion. No pneumothorax. Osseous Structures: Thoracic spine spondylosis. Upper Abdomen: Unremarkable.     IMPRESSION: 1. Enlarged cardiac silhouette. 2. No focal airspace disease. VOICE DICTATED BY: Dr. Orlean Patten        Medications   aspirin tablet 325 mg (325 mg Oral Given 11/26/19 1335)           Procedures    Update notes  1:42 PM-patient was initially fairly tachycardic on initial triage vital signs although nurse informs me with an appropriately sized cuff her blood pressures are improved albeit still mildly hypertensive.  Most recent is 156/75.  She is pending her second troponin.    2:47 PM-troponin essentially flat.  Discussed results with patient. Does have Heart score of 4 and I did discuss admission.  However we discussed that beds are limited in the hospital and we are boarding patients in the emergency department.  She declined admission at this point. Will refer to [place] for close follow-up.  I also recommend close follow-up with her PCP.  Return precautions given.    Disposition:    DC    Diagnosis:     ICD-10-CM ICD-9-CM   1. Chest pain in adult  R07.9 786.50         __________________________________________________________      Please note, this chart was dictated using Dragon dictation, voice recognition software.  While efforts were made to correct any transcription errors, some may inadvertently remain.  Please forgive punctuation and typographic/voice recognition errors.  Please contact me with any questions concerns or for clarification of documentation.

## 2019-11-26 NOTE — ED Notes (Signed)
Pt states she has been dealing with htn for the last month.  For the last week she has had left sided chest pain that radiates to her jaw.  Describes pain as sharp and rates 4/10.  Does not see a cardiologist.  Takes medication for htn.  States she has some shob with exertion.

## 2019-11-26 NOTE — ED Notes (Signed)
Md made aware of pt's BP. MD ordered to hold labetalol.

## 2019-11-26 NOTE — ED Notes (Signed)

## 2019-11-26 NOTE — ED Provider Notes (Signed)
[facility]       HPI:    58 year old female history of obesity, hypertension presents to the emergency department with complaint of levator blood pressure for the past month despite her losartan being increased.  Over the past week she also reported some left-sided chest pain that radiates to her left jaw.  She does have some dyspnea on exertion.  Describes the pain as sharp and rates it a 4 out of 10.  She has no prior known history of CAD CHF VHD or cardiac arrhythmia.  No prior stress test or catheterization    Patient states she had an EKG from her PCP which was abnormal so she was sent to the ED for evaluation.    Patient denies any surgery requiring general anesthesia, travel, or significant immobilization in the past 4 weeks.  Pt denies hemoptysis.  Pt denies asymmetric lower extremity edema.  Pt denies known history of malignancy.  Denies prior history of venous thromboembolism or known hypercoagulable disorder.  Denies use of estrogen containing medications.    She does report a history of GERD and has been on omeprazole for the past few days.  Denies back pain or tearing sensation of the chest/thorax.    Patient declined any medications after my initial evaluation.    REVIEW OF SYSTEMS     ROS Negative Except as Listed in HPI    PAST MEDICAL HISTORY     No past medical history on file.  No past surgical history on file.     None     Allergies   Allergen Reactions   ??? Pcn [Penicillins] Anaphylaxis   ??? Naproxen Swelling   ??? Sulfa (Sulfonamide Antibiotics) Rash        PHYSICAL EXAM       Vitals:    11/26/19 1118   BP: (!) 175/111   Pulse: 71   Resp: 16   Temp: 97.8 ??F (36.6 ??C)   SpO2: 97%    Vital signs were reviewed.     Physical Exam  Vitals signs and nursing note reviewed.   Constitutional:       General: She is not in acute distress.     Appearance: She is obese. She is not ill-appearing, toxic-appearing or diaphoretic.   Neck:      Vascular: No JVD.    Cardiovascular:      Rate and Rhythm: Normal rate and regular rhythm.      Pulses:           Radial pulses are 2+ on the right side and 2+ on the left side.      Heart sounds: No murmur.   Pulmonary:      Effort: Pulmonary effort is normal.      Breath sounds: Normal breath sounds.   Abdominal:      Palpations: Abdomen is soft.   Musculoskeletal:      Comments: No lower extremity edema.  No palpable cords in lower extremities.  No calf knee or thigh tenderness.   Skin:     General: Skin is warm and dry.   Neurological:      Comments: Awake, alert. GCS 15. CN II-XII grossly intact. Speech clear. No gross lateralizing neuro deficits.    Psychiatric:         Behavior: Behavior normal.          MEDICAL DECISION MAKING       Initial Impression and Treatment Plan  Afebrile nontoxic 58 year old female  presented to ED with chest pain for the past week in the setting of increased blood pressures for the past month.  Initial EKG and troponin have already resulted by the time I evaluated her and they are without ischemia.  Patient does have both typical and atypical features for cardiac etiology.  Low risk Wells criteria for PE.  No back pain or tearing sensation the chest/thorax.  No murmurs.  Equal radial pulses.  Aortic dissection seems less likely.  No complaints of abdominal pain nor any abdominal tenderness on exam.    EKG performed at 11:10 AM.  Normal sinus rhythm at 69.  Normal axis.  No ST elevations or depressions.  No significant T wave abnormalities.  Intervals normal      Recent Results (from the past 8 hour(s))   CBC WITH AUTOMATED DIFF    Collection Time: 11/26/19 11:23 AM   Result Value Ref Range    WBC 6.7 4.3 - 11.1 K/uL    RBC 4.95 4.05 - 5.2 M/uL    HGB 14.7 11.7 - 15.4 g/dL    HCT 44.8 35.8 - 46.3 %    MCV 90.5 79.6 - 97.8 FL    MCH 29.7 26.1 - 32.9 PG    MCHC 32.8 31.4 - 35.0 g/dL    RDW 12.8 11.9 - 14.6 %    PLATELET 254 150 - 450 K/uL    MPV 9.5 9.4 - 12.3 FL    ABSOLUTE NRBC 0.00 0.0 - 0.2 K/uL     NEUTROPHILS 45 43 - 78 %    LYMPHOCYTES 46 (H) 13 - 44 %    MONOCYTES 6 4.0 - 12.0 %    EOSINOPHILS 2 0.5 - 7.8 %    BASOPHILS 1 0.0 - 2.0 %    IMMATURE GRANULOCYTES 0 0.0 - 5.0 %    ABS. NEUTROPHILS 3.0 1.7 - 8.2 K/UL    ABS. LYMPHOCYTES 3.1 0.5 - 4.6 K/UL    ABS. MONOCYTES 0.4 0.1 - 1.3 K/UL    ABS. EOSINOPHILS 0.1 0.0 - 0.8 K/UL    ABS. BASOPHILS 0.1 0.0 - 0.2 K/UL    ABS. IMM. GRANS. 0.0 0.0 - 0.5 K/UL    RBC COMMENTS NORMOCYTIC/NORMOCHROMIC      WBC COMMENTS ATYPICAL LYMPHOCYTES PRESENT      PLATELET COMMENTS ADEQUATE      DF AUTOMATED     METABOLIC PANEL, COMPREHENSIVE    Collection Time: 11/26/19 11:23 AM   Result Value Ref Range    Sodium 139 136 - 145 mmol/L    Potassium 3.8 3.5 - 5.1 mmol/L    Chloride 107 98 - 107 mmol/L    CO2 27 21 - 32 mmol/L    Anion gap 5 (L) 7 - 16 mmol/L    Glucose 90 65 - 100 mg/dL    BUN 10 6 - 23 MG/DL    Creatinine 0.84 0.6 - 1.0 MG/DL    GFR est AA >60 >60 ml/min/1.25m    GFR est non-AA >60 >60 ml/min/1.76m   Calcium 9.5 8.3 - 10.4 MG/DL    Bilirubin, total 0.9 0.2 - 1.1 MG/DL    ALT (SGPT) 55 12 - 65 U/L    AST (SGOT) 30 15 - 37 U/L    Alk. phosphatase 97 50 - 136 U/L    Protein, total 7.6 6.3 - 8.2 g/dL    Albumin 3.9 3.5 - 5.0 g/dL    Globulin 3.7 (H) 2.3 - 3.5 g/dL  A-G Ratio 1.1 (L) 1.2 - 3.5     TROPONIN-HIGH SENSITIVITY    Collection Time: 11/26/19 11:23 AM   Result Value Ref Range    Troponin-High Sensitivity 6.5 0 - 14 pg/mL   TROPONIN-HIGH SENSITIVITY    Collection Time: 11/26/19  1:48 PM   Result Value Ref Range    Troponin-High Sensitivity 7.6 0 - 14 pg/mL         Xr Chest Pa Lat    Result Date: 11/26/2019  EXAMINATION: CHEST RADIOGRAPH 11/26/2019 12:26 PM ACCESSION NUMBER: [id number] INDICATION: chest pain and shob COMPARISON: None TECHNIQUE: PA  and lateral views of the chest were obtained. FINDINGS: Cardiac Silhouette: Enlarged. Lungs: No focal airspace disease. Pleura: No pleural effusion. No pneumothorax. Osseous Structures: Thoracic spine spondylosis.  Upper Abdomen: Unremarkable.     IMPRESSION: 1. Enlarged cardiac silhouette. 2. No focal airspace disease. VOICE DICTATED BY: Dr. Orlean Patten        Medications   aspirin tablet 325 mg (325 mg Oral Given 11/26/19 1335)           Procedures    Update notes  1:42 PM-patient was initially fairly tachycardic on initial triage vital signs although nurse informs me with an appropriately sized cuff her blood pressures are improved albeit still mildly hypertensive.  Most recent is 156/75.  She is pending her second troponin.    2:47 PM-troponin essentially flat.  Discussed results with patient. Does have Heart score of 4 and I did discuss admission.  However we discussed that beds are limited in the hospital and we are boarding patients in the emergency department.  She declined admission at this point. Will refer to [place] for close follow-up.  I also recommend close follow-up with her PCP.  Return precautions given.    Disposition:    DC    Diagnosis:     ICD-10-CM ICD-9-CM   1. Chest pain in adult  R07.9 786.50         __________________________________________________________      Please note, this chart was dictated using Dragon dictation, voice recognition software.  While efforts were made to correct any transcription errors, some may inadvertently remain.  Please forgive punctuation and typographic/voice recognition errors.  Please contact me with any questions concerns or for clarification of documentation.

## 2019-11-27 LAB — EKG, 12 LEAD, INITIAL
Atrial Rate: 69 {beats}/min
Calculated P Axis: 74 degrees
Calculated R Axis: 70 degrees
Calculated T Axis: 63 degrees
Diagnosis: NORMAL
P-R Interval: 164 ms
Q-T Interval: 430 ms
QRS Duration: 88 ms
QTC Calculation (Bezet): 460 ms
Ventricular Rate: 69 {beats}/min

## 2019-11-27 LAB — EKG 12-LEAD
Atrial Rate: 69 {beats}/min
Diagnosis: NORMAL
P Axis: 74 degrees
P-R Interval: 164 ms
Q-T Interval: 430 ms
QRS Duration: 88 ms
QTc Calculation (Bazett): 460 ms
R Axis: 70 degrees
T Axis: 63 degrees
Ventricular Rate: 69 {beats}/min

## 2019-12-15 ENCOUNTER — Ambulatory Visit: Attending: Cardiovascular Disease | Primary: Family

## 2019-12-15 ENCOUNTER — Ambulatory Visit
Admit: 2019-12-15 | Discharge: 2019-12-15 | Payer: BLUE CROSS/BLUE SHIELD | Attending: Cardiovascular Disease | Primary: Family

## 2019-12-15 DIAGNOSIS — R0789 Other chest pain: Secondary | ICD-10-CM

## 2019-12-15 NOTE — Progress Notes (Signed)
[facility] History & Physical                 Reason for Visit: Chest pain    Subjective:     Patient is a 59 y.o. female with a PMH of hypertension who presents post emergency room visit as a referral for chest pain.  The patient had 2 troponin levels that were negative in the emergency department.  The patient reports seeing her PCP initially.  She says she had an an ECG that was read as abnormal.  The patient also says she had 2 episodes of chest pain at rest.  It was left-sided and was associated with pain in the jaw and back.  These episodes of chest pain and EKG prompted her to go to the emergency department.  In the emergency department, she had a normal EKG.  The patient does report having arthritis but is able to walk.  She was exercising but has been exercising less recently.    No past medical history on file.   No past surgical history on file.   No family history on file.   Social History     Tobacco Use   ??? Smoking status: Not on file   Substance Use Topics   ??? Alcohol use: Not on file      Allergies   Allergen Reactions   ??? Pcn [Penicillins] Anaphylaxis   ??? Naproxen Swelling   ??? Sulfa (Sulfonamide Antibiotics) Rash         Constitutional:   Negative for fevers and unexplained weight loss.  Eyes:   Negative for visual disturbance.  ENT:   Negative for significant hearing loss and tinnitus.  Respiratory:   Negative for hemoptysis.  Cardiovascular:   Negative except as noted in HPI.  Gastrointestinal:   Negative for melena and abdominal pain.  Genitourinary:   Negative for hematuria, renal stones.  Integumentary:   Negative for rash or non-healing wounds  Hematologic/Lymphatic:   Negative for excessive bleeding hx or clotting disorder.  Musculoskeletal:  Negative for active, unexplained/severe joint pain.  Neurological:   Negative for stroke.   Behavioral/Psych:   Negative for suicidal ideations.    Endocrine:   Negative for uncontrolled diabetic symptoms including polyuria, polydipsia and poor wound healing.        Objective:       Visit Vitals  BP (!) 160/80   Pulse 70   Ht 5\' 6"  (1.676 m)   Wt 322 lb (146.1 kg)   BMI 51.97 kg/m??       BP Readings from Last 3 Encounters:   12/15/19 (!) 160/80   11/26/19 (!) 177/83       Wt Readings from Last 3 Encounters:   12/15/19 322 lb (146.1 kg)   11/26/19 316 lb (143.3 kg)       General/Constitutional:   Alert and oriented x 3, no acute distress  HEENT:   normocephalic, atraumatic, moist mucous membranes  Neck:   No JVD or carotid bruits bilaterally  Cardiovascular:   regular rate and rhythm, no rub/gallop appreciated  Pulmonary:   clear to auscultation bilaterally, no respiratory distress  Abdomen:   soft, non-tender, non-distended  Ext:   No sig LE edema bilaterally  Skin:  warm and dry, no obvious rashes seen  Neuro:   no obvious sensory or motor deficits  Psychiatric:   normal mood and affect      Data Review:   LIPID PANEL   No results for input(s): CHOL,  CHOLPOCT, HDL, LDL, LDLC, LDLCPOC, LDLCEXT, TRIGL, TGLPOCT, CHHD, CHHDX in the last 7224 hours.    Invalid input(s): HCLPOC, LDLCHOL, LDLPOCT       BMP   Recent Labs     11/26/19  1123   NA 139   K 3.8   CL 107   CO2 27   AGAP 5*   GLU 90   BUN 10   CREA 0.84   GFRAA >60   GFRNA >60   CA 9.5        Assessment/Plan:   1. Atypical chest pain  - Obtain an exercise stress echocardiogram    2. Bilateral leg edema  - Obtain a full echocardiogram as part of the stress test    3. Hypertension, unspecified type  - Encouraged her to submit blood pressure readings from home to her primary care provider  - Currently on amlodipine and losartan: PCP to manage    4. Morbid obesity with BMI of 50.0-59.9, adult (HCC)  - Educated on Mediterranean diet and exercise    F/U: As needed    Jillyn Hidden, MD

## 2019-12-15 NOTE — Progress Notes (Signed)
Progress Notes by Hoyle Sauer, MD at 12/15/19 0830                Author: Hoyle Sauer, MD  Service: --  Author Type: Physician       Filed: 12/15/19 0917  Encounter Date: 12/15/2019  Status: Signed          Editor: Hoyle Sauer, MD (Physician)               [facility] History & Physical                      Reason for Visit: Chest pain        Subjective:        Patient is a 59 y.o. female with  a PMH of hypertension who presents post emergency room visit as a referral for chest pain.  The patient had 2 troponin levels that were negative in the emergency department.  The patient reports seeing her PCP initially.  She says she had an an ECG that  was read as abnormal.  The patient also says she had 2 episodes of chest pain at rest.  It was left-sided and was associated with pain in the jaw and back.  These episodes of chest pain and EKG prompted her to go to the emergency department.  In the emergency  department, she had a normal EKG.  The patient does report having arthritis but is able to walk.  She was exercising but has been exercising less recently.      No past medical history on file.    No past surgical history on file.    No family history on file.      Social History          Tobacco Use         ?  Smoking status:  Not on file       Substance Use Topics         ?  Alcohol use:  Not on file           Allergies        Allergen  Reactions         ?  Pcn [Penicillins]  Anaphylaxis     ?  Naproxen  Swelling         ?  Sulfa (Sulfonamide Antibiotics)  Rash              Constitutional:   Negative for fevers and unexplained weight loss.   Eyes:   Negative for visual disturbance.   ENT:   Negative for significant hearing loss and tinnitus.   Respiratory:   Negative for hemoptysis.   Cardiovascular:   Negative except as noted in HPI.   Gastrointestinal:   Negative for melena and abdominal pain.   Genitourinary:   Negative for hematuria, renal stones.   Integumentary:   Negative for rash  or non-healing wounds   Hematologic/Lymphatic:   Negative for excessive bleeding hx or clotting disorder.   Musculoskeletal:  Negative for active, unexplained/severe joint pain.   Neurological:   Negative for stroke.    Behavioral/Psych:   Negative for suicidal ideations.    Endocrine:   Negative for uncontrolled diabetic symptoms including polyuria, polydipsia and poor wound healing.             Objective:           Visit Vitals      BP  (!) 160/80  Pulse  70     Ht  5\' 6"  (1.676 m)     Wt  322 lb (146.1 kg)        BMI  51.97 kg/m??             BP Readings from Last 3 Encounters:        12/15/19  (!) 160/80        11/26/19  (!) 177/83             Wt Readings from Last 3 Encounters:        12/15/19  322 lb (146.1 kg)        11/26/19  316 lb (143.3 kg)           General/Constitutional:   Alert and oriented x 3, no acute distress   HEENT:   normocephalic, atraumatic, moist mucous membranes   Neck:   No JVD or carotid bruits bilaterally   Cardiovascular:   regular rate and rhythm, no rub/gallop appreciated   Pulmonary:   clear to auscultation bilaterally, no respiratory distress   Abdomen:   soft, non-tender, non-distended   Ext:   No sig LE edema bilaterally   Skin:  warm and dry, no obvious rashes seen   Neuro:   no obvious sensory or motor deficits   Psychiatric:   normal mood and affect         Data Review:    LIPID PANEL    No results for input(s): CHOL, CHOLPOCT, HDL, LDL, LDLC, LDLCPOC, LDLCEXT, TRIGL, TGLPOCT, CHHD, CHHDX in the last 7224 hours.      Invalid input(s): HCLPOC, LDLCHOL, LDLPOCT          BMP      Recent Labs           11/26/19   1123     NA  139     K  3.8     CL  107     CO2  27     AGAP  5*     GLU  90     BUN  10     CREA  0.84     GFRAA  >60     GFRNA  >60        CA  9.5              Assessment/Plan:     1. Atypical chest pain   - Obtain an exercise stress echocardiogram      2. Bilateral leg edema   - Obtain a full echocardiogram as part of the stress test      3. Hypertension, unspecified  type   - Encouraged her to submit blood pressure readings from home to her primary care provider   - Currently on amlodipine and losartan: PCP to manage      4. Morbid obesity with BMI of 50.0-59.9, adult (HCC)   - Educated on Mediterranean diet and exercise      F/U: As needed      03-25-1995, MD

## 2020-01-13 ENCOUNTER — Institutional Professional Consult (permissible substitution): Primary: Family

## 2020-01-13 ENCOUNTER — Institutional Professional Consult (permissible substitution): Admit: 2020-01-13 | Payer: BLUE CROSS/BLUE SHIELD | Primary: Family

## 2020-01-13 DIAGNOSIS — R0789 Other chest pain: Secondary | ICD-10-CM

## 2020-01-13 MED ORDER — PERFLUTREN LIPID MICROSPHERES 1.1 MG/ML IV
1.1 mg/mL | Freq: Once | INTRAVENOUS | Status: AC
Start: 2020-01-13 — End: 2020-01-13
  Administered 2020-01-13: 20:00:00 via INTRAVENOUS

## 2020-01-13 NOTE — Progress Notes (Signed)
Full Study Stress Echo completed.  See interpretation scanned to the order.

## 2020-01-13 NOTE — Progress Notes (Signed)
Spoke with pt regarding normal stress echo
